# Patient Record
Sex: Male | Born: 2000 | Race: Black or African American | Hispanic: No | Marital: Single | Smoking: Never smoker
Health system: Southern US, Community
[De-identification: ages and names within clinical notes are randomized; demographics above are authoritative.]

## PROBLEM LIST (undated history)

## (undated) DIAGNOSIS — F909 Attention-deficit hyperactivity disorder, unspecified type: Secondary | ICD-10-CM

---
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
## 2021-09-01 ENCOUNTER — Ambulatory Visit: Payer: Medicaid Other

## 2022-07-19 ENCOUNTER — Other Ambulatory Visit: Payer: Self-pay

## 2022-07-19 ENCOUNTER — Emergency Department
Admission: EM | Admit: 2022-07-19 | Discharge: 2022-07-19 | Disposition: A | Discharge status: Home / Self Care | Payer: Medicaid Other | Attending: Emergency Medicine | Admitting: Emergency Medicine

## 2022-07-19 DIAGNOSIS — Z1152 Encounter for screening for COVID-19: Secondary | ICD-10-CM | POA: Diagnosis not present

## 2022-07-19 DIAGNOSIS — R059 Cough, unspecified: Secondary | ICD-10-CM | POA: Diagnosis present

## 2022-07-19 DIAGNOSIS — J101 Influenza due to other identified influenza virus with other respiratory manifestations: Secondary | ICD-10-CM | POA: Insufficient documentation

## 2022-07-19 LAB — RESP PANEL BY RT-PCR (RSV, FLU A&B, COVID)  RVPGX2
Influenza A by PCR: POSITIVE — AB
Influenza B by PCR: NEGATIVE
Resp Syncytial Virus by PCR: NEGATIVE
SARS Coronavirus 2 by RT PCR: NEGATIVE

## 2022-07-19 MED ORDER — OSELTAMIVIR PHOSPHATE 75 MG PO CAPS: 75.0000 mg | ORAL_CAPSULE | Freq: Two times a day (BID) | ORAL | 0 refills | Status: AC

## 2022-07-19 MED ORDER — PSEUDOEPH-BROMPHEN-DM 30-2-10 MG/5ML PO SYRP: 5.0000 mL | ORAL_SOLUTION | Freq: Four times a day (QID) | ORAL | 0 refills | Status: AC | PRN

## 2022-07-19 NOTE — ED Provider Notes (Signed)
[facility] Provider Note    Event Date/Time   First MD Initiated Contact with Patient 07/19/22 1120     (approximate)   History   Cough   HPI  Michael Chapman is a 21 y.o. male presents to the ED with complaint of cough and tight chest noticed that started last evening.  Patient has not taken any over-the-counter medication.  He reports that he started smoking at the age of 6 and denies any history of asthma.  He is unaware of any known COVID exposure.  Has a history of ADHD.     Physical Exam   Triage Vital Signs: ED Triage Vitals  Enc Vitals Group     BP 07/19/22 1104 138/65     Pulse Rate 07/19/22 1104 (!) 102     Resp 07/19/22 1104 20     Temp 07/19/22 1104 99.7 F (37.6 C)     Temp Source 07/19/22 1104 Oral     SpO2 07/19/22 1104 100 %     Weight 07/19/22 1101 150 lb (68 kg)     Height 07/19/22 1101 5\' 3"  (1.6 m)     Head Circumference --      Peak Flow --      Pain Score 07/19/22 1101 0     Pain Loc --      Pain Edu? --      Excl. in [place]? --     Most recent vital signs: Vitals:   07/19/22 1104  BP: 138/65  Pulse: (!) 102  Resp: 20  Temp: 99.7 F (37.6 C)  SpO2: 100%     General: Awake, no distress.  Able to talk in complete sentences without any difficulty. CV:  Good peripheral perfusion.  Heart regular rate and rhythm. Resp:  Normal effort.  Mild bilateral expiratory wheeze heard throughout. Abd:  No distention.  Other:     ED Results / Procedures / Treatments   Labs (all labs ordered are listed, but only abnormal results are displayed) Labs Reviewed  RESP PANEL BY RT-PCR (RSV, FLU A&B, COVID)  [id number] - Abnormal; Notable for the following components:      Result Value   Influenza A by PCR POSITIVE (*)    All other components within normal limits      PROCEDURES:  Critical Care performed:   Procedures   MEDICATIONS ORDERED IN ED: Medications - No data to display   IMPRESSION / MDM / ASSESSMENT AND  PLAN / ED COURSE  I reviewed the triage vital signs and the nursing notes.   Differential diagnosis includes, but is not limited to, upper respiratory infection, COVID, influenza, RSV, bronchitis  21 year old male presents to the ED with concerns of cough and chest soreness that began last evening.  Patient is not taking any over-the-counter medication.  Patient was made aware that he is positive for influenza A but negative for COVID and RSV.  A prescription for Tamiflu was sent to the pharmacy along with Bromfed-DM as needed for cough and congestion.  Patient is encouraged to drink fluids to stay hydrated, Tylenol or ibuprofen for body aches and fever.  He is to follow-up with his PCP if any continued problems or return to the emergency department if any severe worsening of his symptoms.      Patient's presentation is most consistent with acute complicated illness / injury requiring diagnostic workup.  FINAL CLINICAL IMPRESSION(S) / ED DIAGNOSES   Final diagnoses:  Influenza A  Rx / DC Orders   ED Discharge Orders          Ordered    oseltamivir (TAMIFLU) 75 MG capsule  2 times daily        07/19/22 1215    brompheniramine-pseudoephedrine-DM 30-2-10 MG/5ML syrup  4 times daily PRN        07/19/22 1215             Note:  This document was prepared using Dragon voice recognition software and may include unintentional dictation errors.   Tommi Rumps, PA-C 07/19/22 1448    Phineas Semen, MD 07/19/22 [phone]

## 2022-07-19 NOTE — ED Notes (Signed)
Pt brought in by [facility] for cough and chest soreness.

## 2022-07-19 NOTE — ED Provider Triage Note (Addendum)
Emergency Medicine Provider Triage Evaluation Note  Michael Chapman , a 21 y.o. male  was evaluated in triage.  Pt complains of cough and chest soreness since last pm.  No OTC meds taken.  Review of Systems  Positive: Cough.  + Smoker Negative: No, N/v/d or known fever  Physical Exam  There were no vitals taken for this visit. Gen:   Awake, no distress   Resp:  Normal effort  Lungs mild expiratory wheeze bilaterally.   MSK:   Moves extremities without difficulty  Other:    Medical Decision Making  Medically screening exam initiated at 11:03 AM.  Appropriate orders placed.  Iain Fickel was informed that the remainder of the evaluation will be completed by another provider, this initial triage assessment does not replace that evaluation, and the importance of remaining in the ED until their evaluation is complete.     Tommi Rumps, PA-C 07/19/22 1104    Tommi Rumps, PA-C 07/19/22 1106

## 2022-07-19 NOTE — Discharge Instructions (Addendum)
Follow with your primary care provider or urgent care if any continued problems or concerns.  The flu will need to run its course.  There is 1 medication called Tamiflu which may decrease the length of your illness.  Also a prescription for a cough medication was sent to the pharmacy.  Continue to take Tylenol or ibuprofen as needed for body aches, headache or fever.  Increase fluids to stay hydrated.

## 2022-07-19 NOTE — ED Triage Notes (Signed)
Pt in with co cough and chest soreness since last night.
